# Patient Record
Sex: Male | Born: 1950 | Race: White | Hispanic: No | Marital: Married | Smoking: Never smoker
Health system: Southern US, Community
[De-identification: ages and names within clinical notes are randomized; demographics above are authoritative.]

## PROBLEM LIST (undated history)

## (undated) DIAGNOSIS — N189 Chronic kidney disease, unspecified: Secondary | ICD-10-CM

## (undated) DIAGNOSIS — M1990 Unspecified osteoarthritis, unspecified site: Secondary | ICD-10-CM

## (undated) DIAGNOSIS — N201 Calculus of ureter: Secondary | ICD-10-CM

## (undated) DIAGNOSIS — K635 Polyp of colon: Secondary | ICD-10-CM

## (undated) DIAGNOSIS — E7800 Pure hypercholesterolemia, unspecified: Secondary | ICD-10-CM

## (undated) DIAGNOSIS — K5790 Diverticulosis of intestine, part unspecified, without perforation or abscess without bleeding: Secondary | ICD-10-CM

## (undated) DIAGNOSIS — J45909 Unspecified asthma, uncomplicated: Secondary | ICD-10-CM

## (undated) DIAGNOSIS — I10 Essential (primary) hypertension: Secondary | ICD-10-CM

## (undated) DIAGNOSIS — R748 Abnormal levels of other serum enzymes: Secondary | ICD-10-CM

## (undated) DIAGNOSIS — G43909 Migraine, unspecified, not intractable, without status migrainosus: Secondary | ICD-10-CM

## (undated) DIAGNOSIS — R7303 Prediabetes: Secondary | ICD-10-CM

## (undated) DIAGNOSIS — E781 Pure hyperglyceridemia: Secondary | ICD-10-CM

## (undated) DIAGNOSIS — R3129 Other microscopic hematuria: Secondary | ICD-10-CM

## (undated) HISTORY — PX: VASECTOMY: SHX75

## (undated) HISTORY — PX: TONSILLECTOMY: SUR1361

## (undated) HISTORY — DX: Other microscopic hematuria: R31.29

## (undated) HISTORY — DX: Polyp of colon: K63.5

## (undated) HISTORY — DX: Pure hyperglyceridemia: E78.1

## (undated) HISTORY — DX: Migraine, unspecified, not intractable, without status migrainosus: G43.909

## (undated) HISTORY — DX: Abnormal levels of other serum enzymes: R74.8

## (undated) HISTORY — DX: Prediabetes: R73.03

## (undated) HISTORY — DX: Diverticulosis of intestine, part unspecified, without perforation or abscess without bleeding: K57.90

## (undated) HISTORY — DX: Calculus of ureter: N20.1

## (undated) HISTORY — DX: Unspecified osteoarthritis, unspecified site: M19.90

## (undated) HISTORY — DX: Chronic kidney disease, unspecified: N18.9

## (undated) HISTORY — DX: Unspecified asthma, uncomplicated: J45.909

---

## 1986-07-11 HISTORY — PX: CARDIAC CATHETERIZATION: SHX172

## 2004-08-18 ENCOUNTER — Encounter: Admission: RE | Admit: 2004-08-18 | Discharge: 2004-09-13 | Payer: Self-pay | Admitting: Internal Medicine

## 2006-10-06 ENCOUNTER — Ambulatory Visit (HOSPITAL_COMMUNITY): Admission: RE | Admit: 2006-10-06 | Discharge: 2006-10-06 | Payer: Self-pay | Admitting: Orthopedic Surgery

## 2006-10-09 ENCOUNTER — Encounter: Admission: RE | Admit: 2006-10-09 | Discharge: 2006-10-09 | Payer: Self-pay | Admitting: Specialist

## 2007-11-19 ENCOUNTER — Encounter: Admission: RE | Admit: 2007-11-19 | Discharge: 2007-11-19 | Payer: Self-pay | Admitting: Otolaryngology

## 2009-02-21 ENCOUNTER — Emergency Department (HOSPITAL_COMMUNITY): Admission: EM | Admit: 2009-02-21 | Discharge: 2009-02-21 | Payer: Self-pay | Admitting: Emergency Medicine

## 2009-03-04 ENCOUNTER — Encounter (INDEPENDENT_AMBULATORY_CARE_PROVIDER_SITE_OTHER): Payer: Self-pay

## 2009-03-04 ENCOUNTER — Ambulatory Visit: Payer: Self-pay

## 2009-03-04 ENCOUNTER — Encounter (INDEPENDENT_AMBULATORY_CARE_PROVIDER_SITE_OTHER): Payer: Self-pay | Admitting: Diagnostic Neuroimaging

## 2009-03-04 ENCOUNTER — Encounter: Payer: Self-pay | Admitting: Cardiovascular Disease

## 2009-03-08 ENCOUNTER — Encounter: Admission: RE | Admit: 2009-03-08 | Discharge: 2009-03-08 | Payer: Self-pay | Admitting: Diagnostic Neuroimaging

## 2010-10-17 LAB — BASIC METABOLIC PANEL
Calcium: 9.4 mg/dL (ref 8.4–10.5)
GFR calc Af Amer: >60 mL/min (ref >60)
GFR calc non Af Amer: >60 mL/min (ref >60)
Potassium: 3.7 mEq/L (ref 3.5–5.1)
Sodium: 137 mEq/L (ref 135–145)

## 2010-10-17 LAB — CBC
HCT: 42.6 % (ref 39.0–52.0)
Hemoglobin: 14.8 g/dL (ref 13.0–17.0)
RBC: 4.6 MIL/uL (ref 4.22–5.81)
RDW: 12.7 % (ref 11.5–15.5)
WBC: 10.7 10*3/uL — ABNORMAL HIGH (ref 4.0–10.5)

## 2011-08-12 DIAGNOSIS — R748 Abnormal levels of other serum enzymes: Secondary | ICD-10-CM

## 2011-08-12 DIAGNOSIS — N201 Calculus of ureter: Secondary | ICD-10-CM

## 2011-08-12 HISTORY — DX: Abnormal levels of other serum enzymes: R74.8

## 2011-08-12 HISTORY — DX: Calculus of ureter: N20.1

## 2011-08-16 ENCOUNTER — Emergency Department (HOSPITAL_COMMUNITY)
Admission: EM | Admit: 2011-08-16 | Discharge: 2011-08-16 | Disposition: A | Discharge status: Home / Self Care | Payer: BC Managed Care – PPO | Attending: Emergency Medicine | Admitting: Emergency Medicine

## 2011-08-16 ENCOUNTER — Emergency Department (HOSPITAL_COMMUNITY): Payer: BC Managed Care – PPO

## 2011-08-16 ENCOUNTER — Encounter (HOSPITAL_COMMUNITY): Payer: Self-pay | Admitting: Emergency Medicine

## 2011-08-16 DIAGNOSIS — R1032 Left lower quadrant pain: Secondary | ICD-10-CM | POA: Insufficient documentation

## 2011-08-16 DIAGNOSIS — R110 Nausea: Secondary | ICD-10-CM | POA: Insufficient documentation

## 2011-08-16 DIAGNOSIS — Z8639 Personal history of other endocrine, nutritional and metabolic disease: Secondary | ICD-10-CM | POA: Insufficient documentation

## 2011-08-16 DIAGNOSIS — R10814 Left lower quadrant abdominal tenderness: Secondary | ICD-10-CM | POA: Insufficient documentation

## 2011-08-16 DIAGNOSIS — Z7982 Long term (current) use of aspirin: Secondary | ICD-10-CM | POA: Insufficient documentation

## 2011-08-16 DIAGNOSIS — Z862 Personal history of diseases of the blood and blood-forming organs and certain disorders involving the immune mechanism: Secondary | ICD-10-CM | POA: Insufficient documentation

## 2011-08-16 DIAGNOSIS — I10 Essential (primary) hypertension: Secondary | ICD-10-CM | POA: Insufficient documentation

## 2011-08-16 DIAGNOSIS — E7800 Pure hypercholesterolemia, unspecified: Secondary | ICD-10-CM | POA: Insufficient documentation

## 2011-08-16 DIAGNOSIS — N200 Calculus of kidney: Secondary | ICD-10-CM | POA: Insufficient documentation

## 2011-08-16 DIAGNOSIS — Z79899 Other long term (current) drug therapy: Secondary | ICD-10-CM | POA: Insufficient documentation

## 2011-08-16 HISTORY — DX: Essential (primary) hypertension: I10

## 2011-08-16 HISTORY — DX: Pure hypercholesterolemia, unspecified: E78.00

## 2011-08-16 LAB — CBC
HCT: 44.5 % (ref 39.0–52.0)
Hemoglobin: 15.5 g/dL (ref 13.0–17.0)
MCH: 31.1 pg (ref 26.0–34.0)
MCHC: 34.8 g/dL (ref 30.0–36.0)
MCV: 89.4 fL (ref 78.0–100.0)
RBC: 4.98 MIL/uL (ref 4.22–5.81)

## 2011-08-16 LAB — URINALYSIS, ROUTINE W REFLEX MICROSCOPIC
Ketones, ur: NEGATIVE mg/dL
Leukocytes, UA: NEGATIVE
Nitrite: NEGATIVE
Specific Gravity, Urine: 1.017 (ref 1.005–1.030)
Urobilinogen, UA: 0.2 mg/dL (ref 0.0–1.0)
pH: 6 (ref 5.0–8.0)

## 2011-08-16 LAB — DIFFERENTIAL
Basophils Absolute: 0 10*3/uL (ref 0.0–0.1)
Basophils Relative: 0 % (ref 0–1)
Eosinophils Relative: 0 % (ref 0–5)
Lymphs Abs: 1.2 10*3/uL (ref 0.7–4.0)
Monocytes Absolute: 0.6 10*3/uL (ref 0.1–1.0)
Monocytes Relative: 4 % (ref 3–12)
Neutrophils Relative %: 88 % — ABNORMAL HIGH (ref 43–77)

## 2011-08-16 LAB — POCT I-STAT, CHEM 8
Calcium, Ion: 1.14 mmol/L (ref 1.12–1.32)
Chloride: 106 mEq/L (ref 96–112)
HCT: 45 % (ref 39.0–52.0)
Hemoglobin: 15.3 g/dL (ref 13.0–17.0)

## 2011-08-16 MED ORDER — IOHEXOL 300 MG/ML  SOLN: 20.0000 mL | INTRAMUSCULAR | Status: AC

## 2011-08-16 MED ORDER — HYDROMORPHONE HCL PF 1 MG/ML IJ SOLN
1.0000 mg | Freq: Once | INTRAMUSCULAR | Status: AC
  Administered 2011-08-16: 1 mg via INTRAVENOUS
  Filled 2011-08-16: qty 1

## 2011-08-16 MED ORDER — TAMSULOSIN HCL 0.4 MG PO CAPS: 0.4000 mg | ORAL_CAPSULE | Freq: Every day | ORAL | Status: DC

## 2011-08-16 MED ORDER — HYDROCODONE-ACETAMINOPHEN 5-500 MG PO TABS: 1.0000 | ORAL_TABLET | Freq: Four times a day (QID) | ORAL | Status: AC | PRN

## 2011-08-16 MED ORDER — SODIUM CHLORIDE 0.9 % IV SOLN
INTRAVENOUS | Status: DC
  Administered 2011-08-16: 07:00:00 via INTRAVENOUS

## 2011-08-16 MED ORDER — IOHEXOL 300 MG/ML  SOLN
80.0000 mL | Freq: Once | INTRAMUSCULAR | Status: AC | PRN
  Administered 2011-08-16: 80 mL via INTRAVENOUS

## 2011-08-16 MED ORDER — SODIUM CHLORIDE 0.9 % IV BOLUS (SEPSIS)
500.0000 mL | Freq: Once | INTRAVENOUS | Status: AC
  Administered 2011-08-16: 500 mL via INTRAVENOUS

## 2011-08-16 NOTE — ED Notes (Signed)
PT. REPORTS LLQ PAIN WITH NAUSEA ONSET LAST NIGHT , SLIGHT NAUSEA , NO VOMITTING OR DIARRHEA. DENIES FEVER OR CHILLS.

## 2011-08-16 NOTE — ED Provider Notes (Signed)
History     CSN: [id number]  Arrival date & time 08/16/11  8295   First MD Initiated Contact with Patient 08/16/11 0502      Chief Complaint  Patient presents with  . Abdominal Pain    (Consider location/radiation/quality/duration/timing/severity/associated sxs/prior treatment) Patient is a 61 y.o. male presenting with abdominal pain. The history is provided by the patient.  Abdominal Pain The primary symptoms of the illness include abdominal pain and nausea. The primary symptoms of the illness do not include shortness of breath, vomiting or diarrhea.  Symptoms associated with the illness do not include constipation or back pain.   patient developed left lower quadrant abdominal pain with nausea since last night. He came on acutely. This moved somewhat medially since it started. No fever. He's had a bowel movement since without relief. No constipation. He has similar episode previously, that pain resolved on its own. He then had a colonoscopy that showed diverticulosis. The pain is crampy. No dysuria. No fevers. No trauma.  Past Medical History  Diagnosis Date  . Hypertension   . Gout   . Hypercholesteremia     Past Surgical History  Procedure Date  . Tonsillectomy   . Vasectomy     No family history on file.  History  Substance Use Topics  . Smoking status: Never Smoker   . Smokeless tobacco: Not on file  . Alcohol Use: No      Review of Systems  Constitutional: Negative for activity change and appetite change.  HENT: Negative for neck stiffness.   Eyes: Negative for pain.  Respiratory: Negative for chest tightness and shortness of breath.   Cardiovascular: Negative for chest pain and leg swelling.  Gastrointestinal: Positive for nausea and abdominal pain. Negative for vomiting, diarrhea, constipation and blood in stool.  Genitourinary: Negative for flank pain.  Musculoskeletal: Negative for back pain.  Skin: Negative for rash.  Neurological: Negative for  weakness, numbness and headaches.  Psychiatric/Behavioral: Negative for behavioral problems.    Allergies  Ciprofloxacin; Indomethacin; and Metronidazole  Home Medications   Current Outpatient Rx  Name Route Sig Dispense Refill  . AMLODIPINE BESYLATE 2.5 MG PO TABS Oral Take 2.5 mg by mouth daily.    . ASPIRIN EC 81 MG PO TBEC Oral Take 162 mg by mouth daily.    . CELECOXIB 200 MG PO CAPS Oral Take 200 mg by mouth daily.    Marland Kitchen VITAMIN D 1000 UNITS PO TABS Oral Take 2,000 Units by mouth daily.    . COLCHICINE 0.6 MG PO TABS Oral Take 0.6 mg by mouth daily as needed. For gout flare ups    . EPLERENONE 25 MG PO TABS Oral Take 25 mg by mouth daily.    Marland Kitchen HYDROCHLOROTHIAZIDE 25 MG PO TABS Oral Take 25 mg by mouth daily.    Marland Kitchen LISINOPRIL 10 MG PO TABS Oral Take 10 mg by mouth daily.    . OMEGA-3-ACID ETHYL ESTERS 1 G PO CAPS Oral Take 3 g by mouth 2 (two) times daily.    Marland Kitchen POTASSIUM CHLORIDE ER 8 MEQ PO TBCR Oral Take 24 mEq by mouth daily.    Marland Kitchen HYDROCODONE-ACETAMINOPHEN 5-500 MG PO TABS Oral Take 1-2 tablets by mouth every 6 (six) hours as needed for pain. 15 tablet 0  . TAMSULOSIN HCL 0.4 MG PO CAPS Oral Take 1 capsule (0.4 mg total) by mouth daily. 10 capsule 0    BP 135/82  Pulse 88  Temp(Src) 98.9 F (37.2 C) (Oral)  Resp  16  SpO2 96%  Physical Exam  Nursing note and vitals reviewed. Constitutional: He is oriented to person, place, and time. He appears well-developed and well-nourished.  HENT:  Head: Normocephalic.  Cardiovascular: Normal rate, regular rhythm and normal heart sounds.   No murmur heard. Pulmonary/Chest: Effort normal and breath sounds normal.  Abdominal: Soft. Bowel sounds are normal. He exhibits no distension and no mass. There is tenderness. There is no rebound and no guarding.       Minimal left lower quadrant tenderness without rebound or guarding. No hernias palpated  Musculoskeletal: Normal range of motion. He exhibits no edema.  Neurological: He is alert  and oriented to person, place, and time. No cranial nerve deficit.  Skin: Skin is warm and dry.  Psychiatric: He has a normal mood and affect.    ED Course  Procedures (including critical care time)  Labs Reviewed  CBC - Abnormal; Notable for the following:    WBC 14.4 (*)    All other components within normal limits  DIFFERENTIAL - Abnormal; Notable for the following:    Neutrophils Relative 88 (*)    Neutro Abs 12.6 (*)    Lymphocytes Relative 8 (*)    All other components within normal limits  POCT I-STAT, CHEM 8 - Abnormal; Notable for the following:    Potassium 3.4 (*)    BUN 24 (*)    Glucose, Bld 160 (*)    All other components within normal limits  URINALYSIS, ROUTINE W REFLEX MICROSCOPIC   Ct Abdomen Pelvis W Contrast  08/16/2011  *RADIOLOGY REPORT*  Clinical Data: Left lower quadrant pain.  CT ABDOMEN AND PELVIS WITH CONTRAST  Technique:  Multidetector CT imaging of the abdomen and pelvis was performed following the standard protocol during bolus administration of intravenous contrast.  Contrast: 80mL OMNIPAQUE IOHEXOL 300 MG/ML IV SOLN  Comparison: None.  Findings: Mild atelectasis at the lung bases.  There is diffuse fatty infiltration of the liver.  The gallbladder, pancreas, spleen, adrenal glands are normal.  There is perinephric stranding on the left and mild hydronephrosis. This is secondary to an obstructing calculus within the distal left ureter measuring 4 mm (image 79).  The distal calculus is approximately 4 cm from the vesicoureteral junction.  There is additional 1 mm calculus within the left kidney.  No evidence of right nephrolithiasis or ureterolithiasis. There is delayed renal excretion on the left.  The stomach, small bowel, appendix, and cecum are normal.  The colon demonstrates multiple diverticula through the sigmoid region.  Abdominal aorta normal caliber.  No retroperitoneal periportal lymphadenopathy.  No evidence of bladder stones.  Prostate gland is  normal.  No pelvic lymphadenopathy.  Review of  bone windows demonstrates no aggressive osseous lesions.  IMPRESSION:  1.  Partially obstructing distal left ureteral calculus with mild hydronephrosis on the left and perinephric stranding. 2.  Additional small left renal calculus. 3.  Sigmoid diverticulosis without diverticulitis.  Original Report Authenticated By: Genevive Bi, M.D.     1. Nephrolithiasis       MDM  LLQ abdominal pain. History of same. CT showed stone.         Juliet Rude. Rubin Payor, MD 08/16/11 2256

## 2011-08-16 NOTE — ED Notes (Addendum)
Pt c/o LLQ abdominal pain that radiates to umbilicus since 2230 last night. Pt has been having difficulty with having BM's for the past three days and started taking Metamucil for the last two days.  Stated he was seen at an Urgent care facility some time in September and they diagnosed him with diverticulitis.    Pt has had an episode of nausea around 2230 last night but denies any emesis.

## 2011-08-16 NOTE — ED Provider Notes (Signed)
BP 128/73  Pulse 73  Temp(Src) 98.6 F (37 C) (Oral)  Resp 16  SpO2 97%   0800 Patient signed out from Dr. Rubin Payor. LLQ pain. H/o diverticulosis. CT A/P, U/A pending. Patient requesting additional pain medication at this time. Dilaudid ordered.  0933 AM Patient's pain is well-controlled at this time. He complains of no nausea or vomiting. He is currently tolerating by mouth. CT abdomen pelvis consistent with nephrolithiasis. There is no urinary tract infection by UA and his creatinine is within normal limits although slightly elevated from baseline. He does have a urologist but I have given him urology followup as well. Will discharge home with Vicodin and Flomax. Patient given precautions for return.  Forbes Cellar, MD 08/16/11 [phone]

## 2015-03-10 DIAGNOSIS — T7840XA Allergy, unspecified, initial encounter: Secondary | ICD-10-CM | POA: Insufficient documentation

## 2015-03-10 DIAGNOSIS — H1010 Acute atopic conjunctivitis, unspecified eye: Secondary | ICD-10-CM | POA: Insufficient documentation

## 2015-03-10 DIAGNOSIS — T6391XA Toxic effect of contact with unspecified venomous animal, accidental (unintentional), initial encounter: Secondary | ICD-10-CM | POA: Insufficient documentation

## 2015-03-10 DIAGNOSIS — T63441A Toxic effect of venom of bees, accidental (unintentional), initial encounter: Secondary | ICD-10-CM | POA: Insufficient documentation

## 2015-03-10 DIAGNOSIS — J309 Allergic rhinitis, unspecified: Principal | ICD-10-CM

## 2015-03-10 DIAGNOSIS — T7840XS Allergy, unspecified, sequela: Secondary | ICD-10-CM

## 2015-03-10 DIAGNOSIS — T6391XS Toxic effect of contact with unspecified venomous animal, accidental (unintentional), sequela: Secondary | ICD-10-CM

## 2015-04-08 ENCOUNTER — Ambulatory Visit (INDEPENDENT_AMBULATORY_CARE_PROVIDER_SITE_OTHER): Payer: BLUE CROSS/BLUE SHIELD

## 2015-04-08 DIAGNOSIS — Z91038 Other insect allergy status: Secondary | ICD-10-CM

## 2015-04-08 DIAGNOSIS — Z91030 Bee allergy status: Secondary | ICD-10-CM

## 2015-04-08 DIAGNOSIS — J309 Allergic rhinitis, unspecified: Secondary | ICD-10-CM | POA: Diagnosis not present

## 2015-04-08 DIAGNOSIS — H1010 Acute atopic conjunctivitis, unspecified eye: Secondary | ICD-10-CM

## 2015-05-08 ENCOUNTER — Ambulatory Visit (INDEPENDENT_AMBULATORY_CARE_PROVIDER_SITE_OTHER): Payer: BLUE CROSS/BLUE SHIELD

## 2015-05-08 DIAGNOSIS — J309 Allergic rhinitis, unspecified: Secondary | ICD-10-CM

## 2015-06-03 ENCOUNTER — Ambulatory Visit (INDEPENDENT_AMBULATORY_CARE_PROVIDER_SITE_OTHER): Payer: BLUE CROSS/BLUE SHIELD | Admitting: *Deleted

## 2015-06-03 DIAGNOSIS — J309 Allergic rhinitis, unspecified: Secondary | ICD-10-CM

## 2016-07-20 NOTE — Addendum Note (Signed)
Addended by: Maryjean MornFREEMAN, Brylinn Teaney D on: 07/20/2016 12:06 PM   Modules accepted: Orders

## 2016-11-28 ENCOUNTER — Ambulatory Visit
Admission: EM | Admit: 2016-11-28 | Discharge: 2016-11-28 | Disposition: A | Discharge status: Home / Self Care | Payer: Medicare Other | Attending: Family Medicine | Admitting: Family Medicine

## 2016-11-28 DIAGNOSIS — H109 Unspecified conjunctivitis: Secondary | ICD-10-CM

## 2016-11-28 MED ORDER — POLYMYXIN B-TRIMETHOPRIM 10000-0.1 UNIT/ML-% OP SOLN: 1.0000 [drp] | OPHTHALMIC | 0 refills | Status: AC

## 2016-11-28 NOTE — ED Triage Notes (Signed)
Pt c/o itching and watering of the eyes. Bilateral eye infection, in the morning his eyes are crusted shut.

## 2016-11-28 NOTE — ED Triage Notes (Deleted)
Mom says that he fell and hit his head last night and he hasnt been acting right ever since. He has been really sleep today more than normal, he fell from standing so about 2 feet and hit the right side of his head. Mom changed his diaper this morning around 8am and he hasnt voided since then. He is still drinking he has had around 12 oz of fluid this afternoon. He hasnt thrown up any. He does lay his head on mons chest and cry and touch the right side of his head.

## 2016-11-28 NOTE — ED Provider Notes (Signed)
[facility] ____________________________________________  Time seen: Approximately 9:31 PM  I have reviewed the triage vital signs and the nursing notes.   HISTORY  Chief Complaint Eye Problem   HPI Thomas Campbell is a 66 y.o. male fever evaluation of bilateral eye redness, itching, watering and discharge. Patient reports this started 3 days ago and at that time was only present in his left eye, and reports yesterday he also began having symptoms in right eye. Reports eyes recurrently itchy. States no runny nose, nasal congestion or coughing. States not consistent with normal seasonal allergies. Denies any eye pain, foreign body sensation, trauma. Denies any vision changes. Denies any chemical exposure, sick contacts, welding or concern of foreign bodies. Patient reports last 2 mornings his eyes have been matting shut with continuous drainage throughout the day. Reports otherwise as well. Denies fevers. Denies any facial swelling, rash or skin changes.   Denies recent sickness. Denies recent antibiotic use. Reports does wear glasses. No contact use. Patient also reports that his wife concerning enough similar symptoms.  Merlene LaughterStoneking, Hal, MD: PCP   Past Medical History:  Diagnosis Date  . Chronic kidney disease    left ureteral stone 08/2011- [facility]  . Diverticulosis   . Gout   . Hypercholesteremia   . Hypertension   . Microscopic hematuria    Dr Retta Dionesahlstedt 09/2010 low risk stress test Cardiolite7/2012,1988 cath (in setting vasovagal syncope/a fib )  . Migraine    cartoid dopplers normal    Patient Active Problem List   Diagnosis Date Noted  . Allergic rhinoconjunctivitis 03/10/2015  . 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 Bee sting reaction 03/10/2015  . Allergic reaction 03/10/2015    Past Surgical History:  Procedure Laterality Date  . CARDIAC CATHETERIZATION  1988   cardiolite 01/2011  . TONSILLECTOMY    . VASECTOMY       No current  facility-administered medications for this encounter.   Current Outpatient Prescriptions:  .  amLODipine (NORVASC) 2.5 MG tablet, Take 2.5 mg by mouth daily., Disp: , Rfl:  .  aspirin EC 81 MG tablet, Take 162 mg by mouth daily., Disp: , Rfl:  .  cholecalciferol (VITAMIN D) 1000 UNITS tablet, Take 2,000 Units by mouth daily., Disp: , Rfl:  .  lisinopril (PRINIVIL,ZESTRIL) 10 MG tablet, Take 10 mg by mouth daily., Disp: , Rfl:  .  omega-3 acid ethyl esters (LOVAZA) 1 G capsule, Take 3 g by mouth 2 (two) times daily., Disp: , Rfl:  .  celecoxib (CELEBREX) 200 MG capsule, Take 200 mg by mouth daily., Disp: , Rfl:  .  colchicine 0.6 MG tablet, Take 0.6 mg by mouth daily as needed. For gout flare ups, Disp: , Rfl:  .  EPINEPHrine (EPIPEN 2-PAK) 0.3 mg/0.3 mL IJ SOAJ injection, Inject 0.3 mg into the muscle once., Disp: , Rfl:  .  eplerenone (INSPRA) 25 MG tablet, Take 25 mg by mouth daily., Disp: , Rfl:  .  potassium chloride (KLOR-CON) 8 MEQ tablet, Take 24 mEq by mouth daily., Disp: , Rfl:  .  trimethoprim-polymyxin b (POLYTRIM) ophthalmic solution, Place 1 drop into both eyes every 4 (four) hours., Disp: 10 mL, Rfl: 0 .  VENOMIL HONEY BEE VENOM IJ, Inject as directed every 8 (eight) weeks., Disp: , Rfl:   Allergies Ciprofloxacin; Hctz [hydrochlorothiazide]; Indomethacin; Metronidazole; and Spironolactone  History reviewed. No pertinent family history.  Social History Social History  Substance Use Topics  . Smoking status: Never Smoker  . Smokeless tobacco: Never Used  .  Alcohol use No    Review of Systems Constitutional: No fever/chills Eyes: No visual changes. ENT: No sore throat. Cardiovascular: Denies chest pain. Respiratory: Denies shortness of breath. Gastrointestinal: No abdominal pain.  Musculoskeletal: Negative for back pain. Skin: Negative for rash.   ____________________________________________   PHYSICAL EXAM:  VITAL SIGNS: ED Triage Vitals  Enc Vitals Group      BP 11/28/16 1827 (!) 162/65     Pulse Rate 11/28/16 1827 (!) 51     Resp 11/28/16 1827 18     Temp 11/28/16 1827 98 F (36.7 C)     Temp Source 11/28/16 1827 Oral     SpO2 11/28/16 1827 99 %     Weight 11/28/16 1824 227 lb (103 kg)     Height 11/28/16 1824 6\' 1"  (1.854 m)     Head Circumference --      Peak Flow --      Pain Score 11/28/16 1824 0     Pain Loc --      Pain Edu? --      Excl. in GC? --      Visual Acuity  Right Eye Distance: 20/20 Left Eye Distance: 20/20 Bilateral Distance: 20/20   Constitutional: Alert and oriented. Well appearing and in no acute distress. Eyes: Conjunctiva mild to moderate injection bilaterally. Left active yellowish drainage left, no active right drainage. No appearance of foreign bodies. PERRL. EOMI. no pain with EOMs. No surrounding tenderness, swelling or erythema. Nontender bilaterally. ENT      Head: Normocephalic and atraumatic.      Nose: No congestion/rhinnorhea.      Mouth/Throat: Mucous membranes are moist.Oropharynx non-erythematous. Neck: No stridor. Supple without meningismus.  Hematological/Lymphatic/Immunilogical: No cervical lymphadenopathy. Cardiovascular: Normal rate, regular rhythm. Grossly normal heart sounds.  Good peripheral circulation. Respiratory: Normal respiratory effort without tachypnea nor retractions. Breath sounds are clear and equal bilaterally. No wheezes, rales, rhonchi. Neurologic:  Normal speech and language. Speech is normal. No gait instability.  Skin:  Skin is warm, dry Psychiatric: Mood and affect are normal. Speech and behavior are normal. Patient exhibits appropriate insight and judgment   ___________________________________________   LABS (all labs ordered are listed, but only abnormal results are displayed)  Labs Reviewed - No data to display  PROCEDURES Procedures   INITIAL IMPRESSION / ASSESSMENT AND PLAN / ED COURSE  Pertinent labs & imaging results that were available during my care  of the patient were reviewed by me and considered in my medical decision making (see chart for details).  Well-appearing patient. No acute distress. Suspect bacterial conjunctivitis. Patient declines any concerns of foreign bodies, exposure or trauma. Discussed with patient we'll treat with Polytrim ophthalmic ointment. Encouraged supportive care. Discussed follow-up with ophthalmology as needed for continued complaints.Discussed indication, risks and benefits of medications with patient.  Discussed follow up with Primary care physician this week as needed. Discussed follow up and return parameters including no resolution or any worsening concerns. Patient verbalized understanding and agreed to plan.   ____________________________________________   FINAL CLINICAL IMPRESSION(S) / ED DIAGNOSES  Final diagnoses:  Bacterial conjunctivitis     Discharge Medication List as of 11/28/2016  7:44 PM    START taking these medications   Details  trimethoprim-polymyxin b (POLYTRIM) ophthalmic solution Place 1 drop into both eyes every 4 (four) hours., Starting Mon 11/28/2016, Until Mon 12/05/2016, Normal        Note: This dictation was prepared with Dragon dictation along with smaller phrase technology. Any transcriptional errors that  result from this process are unintentional.         Renford Dills, NP 11/28/16 2135

## 2016-11-28 NOTE — Discharge Instructions (Signed)
Take medication as prescribed.Avoid rubbing.  Follow up with ophthalmology as needed, as discussed.   Follow up with your primary care physician this week as needed. Return to Urgent care for new or worsening concerns.

## 2017-03-29 ENCOUNTER — Other Ambulatory Visit: Payer: Self-pay | Admitting: Geriatric Medicine

## 2017-03-29 DIAGNOSIS — R011 Cardiac murmur, unspecified: Secondary | ICD-10-CM

## 2017-04-06 ENCOUNTER — Other Ambulatory Visit: Payer: Self-pay

## 2017-04-06 ENCOUNTER — Ambulatory Visit (HOSPITAL_COMMUNITY): Payer: BLUE CROSS/BLUE SHIELD | Attending: Cardiology

## 2017-04-06 DIAGNOSIS — N189 Chronic kidney disease, unspecified: Secondary | ICD-10-CM | POA: Diagnosis not present

## 2017-04-06 DIAGNOSIS — I129 Hypertensive chronic kidney disease with stage 1 through stage 4 chronic kidney disease, or unspecified chronic kidney disease: Secondary | ICD-10-CM | POA: Diagnosis not present

## 2017-04-06 DIAGNOSIS — R011 Cardiac murmur, unspecified: Secondary | ICD-10-CM | POA: Diagnosis not present

## 2017-04-06 DIAGNOSIS — E785 Hyperlipidemia, unspecified: Secondary | ICD-10-CM | POA: Insufficient documentation

## 2017-04-06 DIAGNOSIS — I352 Nonrheumatic aortic (valve) stenosis with insufficiency: Secondary | ICD-10-CM | POA: Insufficient documentation

## 2017-05-22 ENCOUNTER — Encounter: Payer: Self-pay | Admitting: Cardiology

## 2017-06-06 ENCOUNTER — Ambulatory Visit (INDEPENDENT_AMBULATORY_CARE_PROVIDER_SITE_OTHER): Payer: BLUE CROSS/BLUE SHIELD | Admitting: Cardiology

## 2017-06-06 ENCOUNTER — Encounter: Payer: Self-pay | Admitting: Cardiology

## 2017-06-06 VITALS — BP 140/84 | HR 50 | Ht 73.0 in | Wt 238.0 lb

## 2017-06-06 DIAGNOSIS — R55 Syncope and collapse: Secondary | ICD-10-CM

## 2017-06-06 DIAGNOSIS — I10 Essential (primary) hypertension: Secondary | ICD-10-CM | POA: Diagnosis not present

## 2017-06-06 DIAGNOSIS — I351 Nonrheumatic aortic (valve) insufficiency: Secondary | ICD-10-CM | POA: Diagnosis not present

## 2017-06-06 NOTE — Patient Instructions (Signed)
Medication Instructions:  OK to take Aspirin 81 mg a day. Continue all other medications as listed.  Testing/Procedures: Your physician has requested that you have an echocardiogram. Echocardiography is a painless test that uses sound waves to create images of your heart. It provides your doctor with information about the size and shape of your heart and how well your heart's chambers and valves are working. This procedure takes approximately one hour. There are no restrictions for this procedure.  Follow-Up: Follow up in 1 year with Dr. Anne FuSkains (with a 2 D Echo).  You will receive a letter in the mail 2 months before you are due.  Please call us when you receive this letter to schedule your follow up appointment.  If you need a refill on your cardiac medications before your next appointment, please call your pharmacy.  Thank you for choosing [facility]!!

## 2017-06-06 NOTE — Progress Notes (Signed)
Cardiology Office Note:    Date:  06/06/2017   ID:  Thomas FlingJames M Garno, DOB 06/09/1951, MRN [id number]  PCP:  Merlene LaughterStoneking, Hal, MD  Cardiologist:  Donato SchultzMark Dacy Enrico, MD   Referring MD: Merlene LaughterStoneking, Hal, MD     History of Present Illness:    Thomas Campbell is a 66 y.o. male here for the evaluation of aortic regurgitation at the request of Dr. Pete GlatterStoneking.  Echocardiogram was performed on 04/06/17 secondary to murmur and demonstrated the following: - Left ventricle: The cavity size was normal. Systolic function was   normal. The estimated ejection fraction was in the range of 60%   to 65%. Wall motion was normal; there were no regional wall   motion abnormalities. Doppler parameters are consistent with   abnormal left ventricular relaxation (grade 1 diastolic   dysfunction). - Aortic valve: There was mild stenosis. There was moderate   regurgitation. Peak velocity (S): 270 cm/s. Mean gradient (S): 15   mm Hg. Valve area (VTI): 1.77 cm^2. Valve area (Vmax): 1.59 cm^2.   Valve area (Vmean): 1.36 cm^2.  In 2010 - mild AR noted on echo.   In review of history, he had a low risk stress test in July 2012, also had a cardiac catheterization in 1988 in the setting of vasovagal syncope/A. fib.  Prior carotid Dopplers were normal in the setting of migraine.  He also had elevated liver enzymes in 2013.  Currently denies any chest pain shortness of breath lower extremity edema, syncope, bleeding, orthopnea.  He has been followed for hypertension where his blood pressures are usually 130-140/70-80.  There was some question in the past about hyperaldosteronism.  He is on eplerenone.  Doing quite well on this medication.  He had gynecomastia on spironolactone.  Overall once again, he is not having any symptoms.  He is a Chief of Staffbiochemist retired.  His brother is a Development worker, communityphysician.   Past Medical History:  Diagnosis Date  . Arthritis    PROBABLE GOUT  . Asthma   . Chronic kidney disease    left ureteral stone 08/2011-  [facility]  . Colon polyp    D MAGOD  . Diverticulosis   . Elevated liver enzymes 08/2011   TEST  . Gout   . Hypercholesteremia   . Hyperglyceridemia   . Hypertension   . Microscopic hematuria    Dr Retta Dionesahlstedt 09/2010 low risk stress test Cardiolite7/2012,1988 cath (in setting vasovagal syncope/a fib )  . Migraine    cartoid dopplers normal  . Prediabetes   . Ureteral stone 08/2011   [facility]    Past Surgical History:  Procedure Laterality Date  . CARDIAC CATHETERIZATION  1988   cardiolite 01/2011  . TONSILLECTOMY    . VASECTOMY      Current Medications: Current Meds  Medication Sig  . albuterol (PROVENTIL HFA) 108 (90 Base) MCG/ACT inhaler Inhale 2 puffs every 6 (six) hours as needed into the lungs for wheezing or shortness of breath.  Marland Kitchen. amLODipine (NORVASC) 2.5 MG tablet Take 2.5 mg by mouth daily.  Marland Kitchen. aspirin EC 81 MG tablet Take 162 mg by mouth daily.  . celecoxib (CELEBREX) 200 MG capsule Take 200 mg by mouth daily.  . cholecalciferol (VITAMIN D) 1000 UNITS tablet Take 2,000 Units by mouth daily.  . colchicine 0.6 MG tablet Take 0.6 mg by mouth daily as needed. For gout flare ups  . eplerenone (INSPRA) 25 MG tablet Take 25 mg by mouth daily.  Marland Kitchen. lisinopril (PRINIVIL,ZESTRIL) 10 MG tablet  Take 10 mg by mouth daily.  . metFORMIN (GLUCOPHAGE-XR) 500 MG 24 hr tablet Take 500 mg daily with breakfast by mouth.  . omega-3 acid ethyl esters (LOVAZA) 1 G capsule Take 3 g by mouth 2 (two) times daily.  . sildenafil (VIAGRA) 100 MG tablet Take 100 mg daily as needed by mouth for erectile dysfunction.     Allergies:   Ciprofloxacin; Hctz [hydrochlorothiazide]; Indomethacin; Metronidazole; and Spironolactone   Social History   Socioeconomic History  . Marital status: Married    Spouse name: None  . Number of children: None  . Years of education: None  . Highest education level: None  Social Needs  . Financial resource strain: None  . Food insecurity -  worry: None  . Food insecurity - inability: None  . Transportation needs - medical: None  . Transportation needs - non-medical: None  Occupational History  . None  Tobacco Use  . Smoking status: Never Smoker  . Smokeless tobacco: Never Used  Substance and Sexual Activity  . Alcohol use: No  . Drug use: No  . Sexual activity: None  Other Topics Concern  . None  Social History Narrative  . None     Family History: The patient's  No early CAD history. Both died of CVA later. Father had silent MI.  ROS:   Please see the history of present illness.     All other systems reviewed and are negative.  EKGs/Labs/Other Studies Reviewed:    The following studies were reviewed today: Prior office notes, echocardiogram, lab work  EKG:  EKG is  ordered today.  The ekg ordered today demonstrates sinus bradycardia rate 50 with poor R wave progression otherwise unremarkable, no other abnormalities personally viewed  Recent Labs: No results found for requested labs within last 8760 hours.  Recent Lipid Panel No results found for: CHOL, TRIG, HDL, CHOLHDL, VLDL, LDLCALC, LDLDIRECT   Total cholesterol 201, HDL 40, LDL 107, triglycerides 275, hemoglobin A1c 5.7, hemoglobin 16.2, creatinine 1.13, potassium 3.9, ALT 24, platelets 187- 09/26/16 labs  Physical Exam:    VS:  BP 140/84   Pulse (!) 50   Ht 6\' 1"  (1.854 m)   Wt 238 lb (108 kg)   SpO2 97%   BMI 31.40 kg/m     Wt Readings from Last 3 Encounters:  06/06/17 238 lb (108 kg)  11/28/16 227 lb (103 kg)     GEN:  Well nourished, well developed in no acute distress HEENT: Normal NECK: No JVD; No carotid bruits LYMPHATICS: No lymphadenopathy CARDIAC: RRR, 2/6 systolic murmur, difficult to hear any diastolic murmur, no rubs, gallops RESPIRATORY:  Clear to auscultation without rales, wheezing or rhonchi  ABDOMEN: Soft, non-tender, non-distended MUSCULOSKELETAL:  No edema; No deformity  SKIN: Warm and dry NEUROLOGIC:  Alert and  oriented x 3 PSYCHIATRIC:  Normal affect   ASSESSMENT:    1. Aortic valve insufficiency, etiology of cardiac valve disease unspecified   2. Essential hypertension   3. Vasovagal syncope    PLAN:    In order of problems listed above:  Aortic regurgitation  -Moderate on echocardiogram in 2018.  Currently not having any symptoms.  No evidence of LV dilatation, no we will continue to monitor.  If becomes severe with LV dilatation or decrease in ejection fraction along with symptoms, aortic valve replacement may be needed.  At this time, continue with current medical management.  Control blood pressure, cholesterol, daily exercise.  Essential hypertension  -Doing very well on current  regimen.  Includes eplerenone, potassium sparing diuretic, with some questionable thoughts on hyperaldosteronism.  Currently well controlled.  Lab work has been monitored by Dr. Pete GlatterStoneking.  Potassium excellent.  Vasovagal syncope  -Has had episodes that are situational.  Resting heart rate is low.  Was hospitalized in the past for this several years ago.  Continue with hydration, know warning signals and lay down.  Medication Adjustments/Labs and Tests Ordered: Current medicines are reviewed at length with the patient today.  Concerns regarding medicines are outlined above.  Orders Placed This Encounter  Procedures  . EKG 12-Lead  . ECHOCARDIOGRAM COMPLETE   No orders of the defined types were placed in this encounter.   Signed, Donato SchultzMark Raed Schalk, MD  06/06/2017 10:54 AM    [facility]

## 2018-05-02 ENCOUNTER — Ambulatory Visit (HOSPITAL_COMMUNITY): Payer: Medicare Other | Attending: Cardiology

## 2018-05-02 ENCOUNTER — Other Ambulatory Visit: Payer: Self-pay

## 2018-05-02 DIAGNOSIS — I351 Nonrheumatic aortic (valve) insufficiency: Secondary | ICD-10-CM | POA: Diagnosis not present

## 2018-05-03 ENCOUNTER — Telehealth: Payer: Self-pay

## 2018-05-03 ENCOUNTER — Telehealth: Payer: Self-pay | Admitting: Cardiology

## 2018-05-03 DIAGNOSIS — I351 Nonrheumatic aortic (valve) insufficiency: Secondary | ICD-10-CM

## 2018-05-03 NOTE — Telephone Encounter (Signed)
New message   Patient is returning call for Echo Results.

## 2018-05-03 NOTE — Telephone Encounter (Signed)
Notes recorded by Sigurd Sos, RN on 05/03/2018 at 2:29 PM EDT The patient has been notified of the result and verbalized understanding. All questions (if any) were answered. Sigurd Sos, RN 05/03/2018 2:28 PM

## 2018-05-03 NOTE — Telephone Encounter (Signed)
-----   Message from Jake Bathe, MD sent at 05/02/2018  8:31 PM EDT ----- Mild AS and mild to moderate aortic regurgitation.  Stable.  Repeat ECHO in one year Donato Schultz, MD

## 2018-05-03 NOTE — Telephone Encounter (Signed)
Informed patient of Echo results/recommendations.  He verbalized understanding.

## 2018-06-12 ENCOUNTER — Ambulatory Visit (INDEPENDENT_AMBULATORY_CARE_PROVIDER_SITE_OTHER): Payer: Medicare Other | Admitting: Cardiology

## 2018-06-12 ENCOUNTER — Encounter: Payer: Self-pay | Admitting: Cardiology

## 2018-06-12 VITALS — BP 148/80 | HR 56 | Ht 73.0 in | Wt 235.8 lb

## 2018-06-12 DIAGNOSIS — I351 Nonrheumatic aortic (valve) insufficiency: Secondary | ICD-10-CM

## 2018-06-12 DIAGNOSIS — R55 Syncope and collapse: Secondary | ICD-10-CM

## 2018-06-12 DIAGNOSIS — I10 Essential (primary) hypertension: Secondary | ICD-10-CM | POA: Diagnosis not present

## 2018-06-12 NOTE — Progress Notes (Signed)
Cardiology Office Note:    Date:  06/12/2018   ID:  Thomas Campbell, DOB May 04, 1951, MRN [id number]  PCP:  Gale Journey, MD  Cardiologist:  Donato Schultz, MD  Electrophysiologist:  None   Referring MD: Merlene Laughter, MD     History of Present Illness:    Thomas Campbell is a 67 y.o. male is here for follow-up of aortic regurgitation.  Low risk stress test in July 2012, also had a cardiac catheterization in 1988 in the setting of vasovagal syncope/A. fib.  Prior carotid Dopplers were normal in the setting of migraine.  He also had elevated liver enzymes in 2013.  There was some question about possible hyperaldosteronism and he is on eplerenone.  He had gynecomastia on spironolactone in the past.   Denies any fevers chills nausea vomiting chest pain shortness of breath.  [place]. When sitting up for a while gasping once a day. Pulse ox 94.   History of panic attacks with decreased day light. Feels harder to breath. Vit D has helped. Doubles dose.   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  Past Medical History:  Diagnosis Date  . Arthritis    PROBABLE GOUT  . Asthma   . Chronic kidney disease    left ureteral stone 08/2011- [facility]  . Colon polyp    D MAGOD  . Diverticulosis   . Elevated liver enzymes 08/2011   TEST  . Gout   . Hypercholesteremia   . Hyperglyceridemia   . Hypertension   . Microscopic hematuria    Dr Retta Diones 09/2010 low risk stress test Cardiolite7/2012,1988 cath (in setting vasovagal syncope/a fib )  . Migraine    cartoid dopplers normal  . Prediabetes   . Ureteral stone 08/2011   [facility]    Past Surgical History:  Procedure Laterality Date  . CARDIAC CATHETERIZATION  1988   cardiolite 01/2011  . TONSILLECTOMY    . VASECTOMY      Current Medications: Current Meds  Medication Sig  . albuterol (PROVENTIL HFA) 108 (90 Base) MCG/ACT inhaler Inhale 2 puffs every 6 (six) hours as needed into  the lungs for wheezing or shortness of breath.  Marland Kitchen amLODipine (NORVASC) 2.5 MG tablet Take 2.5 mg by mouth daily.  Marland Kitchen aspirin EC 81 MG tablet Take 81 mg by mouth daily.  . celecoxib (CELEBREX) 200 MG capsule Take 200 mg by mouth daily.  . cholecalciferol (VITAMIN D) 1000 UNITS tablet Take 2,000 Units by mouth daily.  . colchicine 0.6 MG tablet Take 0.6 mg by mouth daily as needed. For gout flare ups  . eplerenone (INSPRA) 25 MG tablet Take 25 mg by mouth daily.  . fluticasone (FLONASE) 50 MCG/ACT nasal spray Place 1 spray into both nostrils daily.  . fluticasone furoate-vilanterol (BREO ELLIPTA) 100-25 MCG/INH AEPB Inhale 1 puff into the lungs daily.  Marland Kitchen lisinopril (PRINIVIL,ZESTRIL) 10 MG tablet Take 10 mg by mouth daily.  . metFORMIN (GLUCOPHAGE-XR) 500 MG 24 hr tablet Take 500 mg daily with breakfast by mouth.  . omega-3 acid ethyl esters (LOVAZA) 1 G capsule Take 3 g by mouth 2 (two) times daily.  . sildenafil (VIAGRA) 100 MG tablet Take 100 mg daily as needed by mouth for erectile dysfunction.     Allergies:   Ciprofloxacin; Hctz [hydrochlorothiazide]; Indomethacin; Metronidazole; and Spironolactone   Social History   Socioeconomic History  . Marital status: Married    Spouse name: Not on file  . Number  of children: Not on file  . Years of education: Not on file  . Highest education level: Not on file  Occupational History  . Not on file  Social Needs  . Financial resource strain: Not on file  . Food insecurity:    Worry: Not on file    Inability: Not on file  . Transportation needs:    Medical: Not on file    Non-medical: Not on file  Tobacco Use  . Smoking status: Never Smoker  . Smokeless tobacco: Never Used  Substance and Sexual Activity  . Alcohol use: No  . Drug use: No  . Sexual activity: Not on file  Lifestyle  . Physical activity:    Days per week: Not on file    Minutes per session: Not on file  . Stress: Not on file  Relationships  . Social connections:     Talks on phone: Not on file    Gets together: Not on file    Attends religious service: Not on file    Active member of club or organization: Not on file    Attends meetings of clubs or organizations: Not on file    Relationship status: Not on file  Other Topics Concern  . Not on file  Social History Narrative  . Not on file     Family History: The patient's family history is not on file.  No early family history of coronary artery disease  ROS:   Please see the history of present illness.     All other systems reviewed and are negative.  EKGs/Labs/Other Studies Reviewed:    The following studies were reviewed today:  ECHO 05/02/18: - Left ventricle: Average longitudinal LV strain is normal at   -21.3% The cavity size was mildly dilated. Systolic function was   normal. The estimated ejection fraction was in the range of 55%   to 60%. Wall motion was normal; there were no regional wall   motion abnormalities. Left ventricular diastolic function   parameters were normal. - Aortic valve: Trileaflet; severely thickened, severely calcified   leaflets. There was mild stenosis. There was mild to moderate   regurgitation. Mean gradient (S): 18 mm Hg. Peak gradient (S): 35   mm Hg. Regurgitation pressure half-time: 669 ms. - Aorta: Aortic root dimension: 38 mm (ED). Ascending aortic   diameter: 38 mm (S). - Aortic root: The aortic root was mildly dilated. - Ascending aorta: The ascending aorta was mildly dilated. - Left atrium: The atrium was mildly dilated.  EKG:  EKG is  ordered today.  The ekg ordered today demonstrates heart rate 56 sinus bradycardia, slightly ectopic atrial rhythm downward P wave in V2 and V3.  Borderline short PR personally reviewed and interpreted.  No significant change from prior  Recent Labs: No results found for requested labs within last 8760 hours.  Recent Lipid Panel No results found for: CHOL, TRIG, HDL, CHOLHDL, VLDL, LDLCALC,  LDLDIRECT  Physical Exam:    VS:  BP (!) 148/80   Pulse (!) 56   Ht 6\' 1"  (1.854 m)   Wt 235 lb 12.8 oz (107 kg)   BMI 31.11 kg/m     Wt Readings from Last 3 Encounters:  06/12/18 235 lb 12.8 oz (107 kg)  06/06/17 238 lb (108 kg)  11/28/16 227 lb (103 kg)     GEN:  Well nourished, well developed in no acute distress HEENT: Normal NECK: No JVD; No carotid bruits LYMPHATICS: No lymphadenopathy CARDIAC: RRR, 1/6  DM RUSB, no rubs, gallops RESPIRATORY:  Clear to auscultation without rales, wheezing or rhonchi  ABDOMEN: Soft, non-tender, non-distended MUSCULOSKELETAL:  No edema; No deformity  SKIN: Warm and dry NEUROLOGIC:  Alert and oriented x 3 PSYCHIATRIC:  Normal affect   ASSESSMENT:    1. Aortic valve insufficiency, etiology of cardiac valve disease unspecified   2. Essential hypertension   3. Vasovagal syncope    PLAN:    In order of problems listed above:  Mild to moderate aortic regurgitation - Stable on echocardiogram.  Been doing quite well.  No symptoms.  Continuing to monitor.  Essential hypertension - On eplerenone.  Doing quite well.  Worried about possible upper aldosteronism.  Vasovagal syncope -Has had this in the past, hydration, salt liberalization.  He was hospitalized for this several years ago.  Situational.  Panic attacks in the fall - When daytime shortens, can have more panic attacks.  Vitamin D has helped him. - He also demonstrated some involuntary gasping when sitting up, alert, maybe once a day which is unusual for him.  Pulse ox at the time was 94%.  Medication Adjustments/Labs and Tests Ordered: Current medicines are reviewed at length with the patient today.  Concerns regarding medicines are outlined above.  Orders Placed This Encounter  Procedures  . EKG 12-Lead  . ECHOCARDIOGRAM COMPLETE   No orders of the defined types were placed in this encounter.   Patient Instructions  Medication Instructions:  Please decrease your  Aspirin to 81 mg daily.  Continue all other medications as listed.  If you need a refill on your cardiac medications before your next appointment, please call your pharmacy.   Testing/Procedures: Your physician has requested that you have an echocardiogram in 1 year. Echocardiography is a painless test that uses sound waves to create images of your heart. It provides your doctor with information about the size and shape of your heart and how well your heart's chambers and valves are working. This procedure takes approximately one hour. There are no restrictions for this procedure.  Follow-Up: At [facility], you and your health needs are our priority.  As part of our continuing mission to provide you with exceptional heart care, we have created designated Provider Care Teams.  These Care Teams include your primary Cardiologist (physician) and Advanced Practice Providers (APPs -  Physician Assistants and Nurse Practitioners) who all work together to provide you with the care you need, when you need it. You will need a follow up appointment in 12 months.  Please call our office 2 months in advance to schedule this appointment.  You may see Donato Schultz, MD or one of the following Advanced Practice Providers on your designated Care Team:   Norma Fredrickson, NP Nada Boozer, NP . Georgie Chard, NP  Thank you for choosing [facility]!!         Signed, Donato Schultz, MD  06/12/2018 12:08 PM    [facility]

## 2018-06-12 NOTE — Patient Instructions (Signed)
Medication Instructions:  Please decrease your Aspirin to 81 mg daily.  Continue all other medications as listed.  If you need a refill on your cardiac medications before your next appointment, please call your pharmacy.   Testing/Procedures: Your physician has requested that you have an echocardiogram in 1 year. Echocardiography is a painless test that uses sound waves to create images of your heart. It provides your doctor with information about the size and shape of your heart and how well your heart's chambers and valves are working. This procedure takes approximately one hour. There are no restrictions for this procedure.  Follow-Up: At [facility], you and your health needs are our priority.  As part of our continuing mission to provide you with exceptional heart care, we have created designated Provider Care Teams.  These Care Teams include your primary Cardiologist (physician) and Advanced Practice Providers (APPs -  Physician Assistants and Nurse Practitioners) who all work together to provide you with the care you need, when you need it. You will need a follow up appointment in 12 months.  Please call our office 2 months in advance to schedule this appointment.  You may see Donato SchultzMark Skains, MD or one of the following Advanced Practice Providers on your designated Care Team:   Norma FredricksonLori Gerhardt, NP Nada BoozerLaura Ingold, NP . Georgie ChardJill McDaniel, NP  Thank you for choosing [facility]!!

## 2019-06-18 ENCOUNTER — Encounter (HOSPITAL_COMMUNITY): Payer: Self-pay | Admitting: Cardiology

## 2019-09-25 ENCOUNTER — Other Ambulatory Visit: Payer: Self-pay

## 2019-09-25 ENCOUNTER — Encounter (INDEPENDENT_AMBULATORY_CARE_PROVIDER_SITE_OTHER): Payer: Self-pay

## 2019-09-25 ENCOUNTER — Ambulatory Visit (HOSPITAL_COMMUNITY): Payer: Medicare Other | Attending: Internal Medicine

## 2019-09-25 DIAGNOSIS — I351 Nonrheumatic aortic (valve) insufficiency: Secondary | ICD-10-CM | POA: Insufficient documentation

## 2019-10-17 ENCOUNTER — Encounter: Payer: Self-pay | Admitting: Cardiology

## 2019-10-17 ENCOUNTER — Ambulatory Visit (INDEPENDENT_AMBULATORY_CARE_PROVIDER_SITE_OTHER)
Admission: RE | Admit: 2019-10-17 | Discharge: 2019-10-17 | Disposition: A | Discharge status: Home / Self Care | Payer: Self-pay | Source: Ambulatory Visit | Attending: Cardiology | Admitting: Cardiology

## 2019-10-17 ENCOUNTER — Other Ambulatory Visit: Payer: Self-pay

## 2019-10-17 ENCOUNTER — Ambulatory Visit (INDEPENDENT_AMBULATORY_CARE_PROVIDER_SITE_OTHER): Payer: Medicare Other | Admitting: Cardiology

## 2019-10-17 VITALS — BP 120/70 | HR 53 | Ht 73.0 in | Wt 237.0 lb

## 2019-10-17 DIAGNOSIS — I77810 Thoracic aortic ectasia: Secondary | ICD-10-CM | POA: Diagnosis not present

## 2019-10-17 DIAGNOSIS — E785 Hyperlipidemia, unspecified: Secondary | ICD-10-CM

## 2019-10-17 DIAGNOSIS — I351 Nonrheumatic aortic (valve) insufficiency: Secondary | ICD-10-CM

## 2019-10-17 DIAGNOSIS — I10 Essential (primary) hypertension: Secondary | ICD-10-CM | POA: Diagnosis not present

## 2019-10-17 NOTE — Patient Instructions (Signed)
Medication Instructions:  The current medical regimen is effective;  continue present plan and medications.  *If you need a refill on your cardiac medications before your next appointment, please call your pharmacy*  Testing/Procedures: Your physician has requested that you have an echocardiogram in 1 year. Echocardiography is a painless test that uses sound waves to create images of your heart. It provides your doctor with information about the size and shape of your heart and how well your heart's chambers and valves are working. This procedure takes approximately one hour. There are no restrictions for this procedure.  Your physician has requested that you have Coronary Calcium Score. This test is completed by Cardiac computed tomography (CT) is a painless test that uses an x-ray machine to take clear, detailed pictures of your heart. The cost is $150 due at the time of testing.  Follow-Up: At [facility], you and your health needs are our priority.  As part of our continuing mission to provide you with exceptional heart care, we have created designated Provider Care Teams.  These Care Teams include your primary Cardiologist (physician) and Advanced Practice Providers (APPs -  Physician Assistants and Nurse Practitioners) who all work together to provide you with the care you need, when you need it.  We recommend signing up for the patient portal called "MyChart".  Sign up information is provided on this After Visit Summary.  MyChart is used to connect with patients for Virtual Visits (Telemedicine).  Patients are able to view lab/test results, encounter notes, upcoming appointments, etc.  Non-urgent messages can be sent to your provider as well.   To learn more about what you can do with MyChart, go to [email or web address].    Your next appointment:   12 month(s)  The format for your next appointment:   In Person  Provider:   Donato Schultz, MD   Thank you for choosing [facility]!!

## 2019-10-17 NOTE — Progress Notes (Signed)
Cardiology Office Note:    Date:  10/17/2019   ID:  Thomas Campbell, DOB [birth year], MRN [id number]  PCP:  Thomas Journey, MD  Cardiologist:  Thomas Schultz, MD  Electrophysiologist:  None   Referring MD: Thomas Journey, MD     History of Present Illness:    Thomas Campbell is a 69 y.o. male here for the follow-up of aortic regurgitation dilated aortic root.  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  Had gynecomastia in the past secondary to spironolactone.  Overall been doing quite well.  No fevers chills nausea vomiting syncope.  Recent echocardiogram 09/25/2019: Normal pump function Mild to moderate aortic valve regurgitation Mild aortic valve stenosis Dilated aortic root 41 mm.   Stable.  Will likely repeat ECHO in 2 years.   Past Medical History:  Diagnosis Date  . Arthritis    PROBABLE GOUT  . Asthma   . Chronic kidney disease    left ureteral stone 08/2011- [facility]  . Colon polyp    D MAGOD  . Diverticulosis   . Elevated liver enzymes 08/2011   TEST  . Gout   . Hypercholesteremia   . Hyperglyceridemia   . Hypertension   . Microscopic hematuria    Dr Thomas Campbell 09/2010 low risk stress test Cardiolite7/2012,1988 cath (in setting vasovagal syncope/a fib )  . Migraine    cartoid dopplers normal  . Prediabetes   . Ureteral stone 08/2011   [facility]    Past Surgical History:  Procedure Laterality Date  . CARDIAC CATHETERIZATION  1988   cardiolite 01/2011  . TONSILLECTOMY    . VASECTOMY      Current Medications: Current Meds  Medication Sig  . albuterol (PROVENTIL HFA) 108 (90 Base) MCG/ACT inhaler Inhale 2 puffs every 6 (six) hours as needed into the lungs for wheezing or shortness of breath.  Marland Kitchen amLODipine (NORVASC) 5 MG tablet Take 5 mg by mouth 2 (two) times daily.  Marland Kitchen aspirin EC 81 MG tablet Take 81 mg by mouth in the morning and at bedtime.   . celecoxib (CELEBREX) 200 MG capsule Take 200 mg by mouth daily.    . cholecalciferol (VITAMIN D) 1000 UNITS tablet Take 2,000 Units by mouth daily.  . colchicine 0.6 MG tablet Take 0.6 mg by mouth daily as needed. For gout flare ups  . eplerenone (INSPRA) 50 MG tablet Take 50 mg by mouth in the morning and at bedtime.  Marland Kitchen ezetimibe (ZETIA) 10 MG tablet Take 10 mg by mouth daily. Not Started Taking this Medication.  . fluticasone (FLONASE) 50 MCG/ACT nasal spray Place 1 spray into both nostrils daily.  . fluticasone furoate-vilanterol (BREO ELLIPTA) 100-25 MCG/INH AEPB Inhale 1 puff into the lungs daily.  Marland Kitchen lisinopril (PRINIVIL,ZESTRIL) 10 MG tablet Take 10 mg by mouth in the morning and at bedtime.   . metFORMIN (GLUCOPHAGE-XR) 500 MG 24 hr tablet Take 500 mg daily with breakfast by mouth.  . montelukast (SINGULAIR) 10 MG tablet Take 10 mg by mouth daily.  Marland Kitchen omega-3 acid ethyl esters (LOVAZA) 1 G capsule Take 3 g by mouth 2 (two) times daily.  . sildenafil (VIAGRA) 100 MG tablet Take 100 mg daily as needed by mouth for erectile dysfunction.     Allergies:   Ciprofloxacin, Hctz [hydrochlorothiazide], Indomethacin, Metronidazole, and Spironolactone   Social History   Socioeconomic History  . Marital status: Married    Spouse name: Not on file  . Number of  children: Not on file  . Years of education: Not on file  . Highest education level: Not on file  Occupational History  . Not on file  Tobacco Use  . Smoking status: Never Smoker  . Smokeless tobacco: Never Used  Substance and Sexual Activity  . Alcohol use: No  . Drug use: No  . Sexual activity: Not on file  Other Topics Concern  . Not on file  Social History Narrative  . Not on file   Social Determinants of Health   Financial Resource Strain:   . Difficulty of Paying Living Expenses:   Food Insecurity:   . Worried About Programme researcher, broadcasting/film/video in the Last Year:   . Barista in the Last Year:   Transportation Needs:   . Freight forwarder (Medical):   Marland Kitchen Lack of Transportation  (Non-Medical):   Physical Activity:   . Days of Exercise per Week:   . Minutes of Exercise per Session:   Stress:   . Feeling of Stress :   Social Connections:   . Frequency of Communication with Friends and Family:   . Frequency of Social Gatherings with Friends and Family:   . Attends Religious Services:   . Active Member of Clubs or Organizations:   . Attends Banker Meetings:   Marland Kitchen Marital Status:        ROS:   Please see the history of present illness.     All other systems reviewed and are negative.  EKGs/Labs/Other Studies Reviewed:    The following studies were reviewed today:  1. Left ventricular ejection fraction, by estimation, is 65 to 70%. The  left ventricle has normal function. The left ventricle has no regional  wall motion abnormalities. There is mild left ventricular hypertrophy.  Left ventricular diastolic parameters  are consistent with Grade I diastolic dysfunction (impaired relaxation).  2. Right ventricular systolic function is normal. The right ventricular  size is normal.  3. The mitral valve is abnormal. Trivial mitral valve regurgitation.  4. The aortic valve is tricuspid. Aortic valve regurgitation is mild to  moderate. Mild aortic valve stenosis. Aortic regurgitation PHT measures  412 msec. Aortic valve area, by VTI measures 1.65 cm. Aortic valve mean  gradient measures 15.4 mmHg. Aortic  valve Vmax measures 2.73 m/s.  5. Aortic dilatation noted. There is mild dilatation of the ascending  aorta measuring 41 mm.  6. The inferior vena cava is normal in size with greater than 50%  respiratory variability, suggesting right atrial pressure of 3 mmHg.   Comparison(s): 05/02/18 EF 55-60%. Mild AS mean, peak.  Mild-moderate AI.   EKG:  EKG is  ordered today.  The ekg ordered today demonstrates SB 53, LAD  Recent Labs: No results found for requested labs within last 8760 hours.  Recent Lipid Panel No results found  for: CHOL, TRIG, HDL, CHOLHDL, VLDL, LDLCALC, LDLDIRECT  Physical Exam:    VS:  BP 120/70   Pulse (!) 53   Ht 6\' 1"  (1.854 m)   Wt 237 lb (107.5 kg)   SpO2 96%   BMI 31.27 kg/m     Wt Readings from Last 3 Encounters:  10/17/19 237 lb (107.5 kg)  06/12/18 235 lb 12.8 oz (107 kg)  06/06/17 238 lb (108 kg)     GEN:  Well nourished, well developed in no acute distress HEENT: Normal NECK: No JVD; No carotid bruits LYMPHATICS: No lymphadenopathy CARDIAC: RRR, no murmurs, rubs, gallops  RESPIRATORY:  Clear to auscultation without rales, wheezing or rhonchi  ABDOMEN: Soft, non-tender, non-distended MUSCULOSKELETAL:  No edema; No deformity  SKIN: Warm and dry NEUROLOGIC:  Alert and oriented x 3 PSYCHIATRIC:  Normal affect   ASSESSMENT:    1. Essential hypertension   2. Aortic valve insufficiency, etiology of cardiac valve disease unspecified   3. Dilated aortic root ([place])   4. Hyperlipidemia, unspecified hyperlipidemia type    PLAN:    In order of problems listed above:  Hyperlipidemia -LDL 136 - tried to avoid statins in the psat. Tried Lipitor and Crestor had muscle pain.  We will go ahead and check a coronary calcium score.  If he has evidence of atherosclerosis, I will go ahead and encourage reinitiation of statin use to help reduce his risk of heart attack and stroke.  If we do need to use, I would start with low-dose Crestor 5 mg again and see how he does.  I tried to reassure him about the overall risks associated with statin use.  At this time, we can hold off on Zetia.  I do not think given his situation that he would qualify for Repatha.  Of course if we ever needed to go down this pathway, I would have him sit down with our pharmacy lead hyperlipidemia team.  Aortic regurgitation -Stable see echocardiogram report above.  Mild aortic root dilatation -Minimal change on recent echocardiogram 41 mm.  Previously 75 and 39.  We will repeat echocardiogram in 1  year.  Family history of stroke -he is currently taking aspirin 81 mg twice a day for stroke prevention.   Has had prior episodes of vasovagal syncope years ago.  Has had some anxiety and panic attacks in the past.     Medication Adjustments/Labs and Tests Ordered: Current medicines are reviewed at length with the patient today.  Concerns regarding medicines are outlined above.  Orders Placed This Encounter  Procedures  . CT CARDIAC SCORING  . EKG 12-Lead  . ECHOCARDIOGRAM COMPLETE   No orders of the defined types were placed in this encounter.   Patient Instructions  Medication Instructions:  The current medical regimen is effective;  continue present plan and medications.  *If you need a refill on your cardiac medications before your next appointment, please call your pharmacy*  Testing/Procedures: Your physician has requested that you have an echocardiogram in 1 year. Echocardiography is a painless test that uses sound waves to create images of your heart. It provides your doctor with information about the size and shape of your heart and how well your heart's chambers and valves are working. This procedure takes approximately one hour. There are no restrictions for this procedure.  Your physician has requested that you have Coronary Calcium Score. This test is completed by Cardiac computed tomography (CT) is a painless test that uses an x-ray machine to take clear, detailed pictures of your heart. The cost is $150 due at the time of testing.  Follow-Up: At [facility], you and your health needs are our priority.  As part of our continuing mission to provide you with exceptional heart care, we have created designated Provider Care Teams.  These Care Teams include your primary Cardiologist (physician) and Advanced Practice Providers (APPs -  Physician Assistants and Nurse Practitioners) who all work together to provide you with the care you need, when you need it.  We  recommend signing up for the patient portal called "MyChart".  Sign up information is provided on this  After Visit Summary.  MyChart is used to connect with patients for Virtual Visits (Telemedicine).  Patients are able to view lab/test results, encounter notes, upcoming appointments, etc.  Non-urgent messages can be sent to your provider as well.   To learn more about what you can do with MyChart, go to [email or web address].    Your next appointment:   12 month(s)  The format for your next appointment:   In Person  Provider:   Donato Schultz, MD   Thank you for choosing [facility]!!        Signed, Thomas Schultz, MD  10/17/2019 10:33 AM    [facility]

## 2020-10-15 ENCOUNTER — Ambulatory Visit (HOSPITAL_COMMUNITY): Payer: Medicare Other | Attending: Cardiovascular Disease

## 2020-10-15 ENCOUNTER — Other Ambulatory Visit: Payer: Self-pay

## 2020-10-15 DIAGNOSIS — I351 Nonrheumatic aortic (valve) insufficiency: Secondary | ICD-10-CM | POA: Diagnosis not present

## 2020-10-15 DIAGNOSIS — I77810 Thoracic aortic ectasia: Secondary | ICD-10-CM | POA: Diagnosis present

## 2020-10-15 LAB — ECHOCARDIOGRAM COMPLETE
AR max vel: 2.04 cm2
AV Area VTI: 2.46 cm2
AV Area mean vel: 2.18 cm2
AV Mean grad: 23 mmHg
AV Peak grad: 50.1 mmHg
Ao pk vel: 3.54 m/s
Area-P 1/2: 4.15 cm2
P 1/2 time: 828 msec
S' Lateral: 3.2 cm

## 2020-11-30 ENCOUNTER — Other Ambulatory Visit: Payer: Self-pay

## 2020-11-30 ENCOUNTER — Encounter: Payer: Self-pay | Admitting: Cardiology

## 2020-11-30 ENCOUNTER — Ambulatory Visit (INDEPENDENT_AMBULATORY_CARE_PROVIDER_SITE_OTHER): Payer: Medicare Other | Admitting: Cardiology

## 2020-11-30 VITALS — BP 130/70 | HR 50 | Ht 73.0 in | Wt 223.0 lb

## 2020-11-30 DIAGNOSIS — I77810 Thoracic aortic ectasia: Secondary | ICD-10-CM | POA: Diagnosis not present

## 2020-11-30 DIAGNOSIS — I350 Nonrheumatic aortic (valve) stenosis: Secondary | ICD-10-CM | POA: Diagnosis not present

## 2020-11-30 DIAGNOSIS — I351 Nonrheumatic aortic (valve) insufficiency: Secondary | ICD-10-CM

## 2020-11-30 DIAGNOSIS — I10 Essential (primary) hypertension: Secondary | ICD-10-CM

## 2020-11-30 NOTE — Patient Instructions (Signed)
Medication Instructions:  The current medical regimen is effective;  continue present plan and medications.  *If you need a refill on your cardiac medications before your next appointment, please call your pharmacy*  Testing/Procedures: Your physician has requested that you have an echocardiogram. Echocardiography is a painless test that uses sound waves to create images of your heart. It provides your doctor with information about the size and shape of your heart and how well your heart's chambers and valves are working. This procedure takes approximately one hour. There are no restrictions for this procedure.  Follow-Up: At [facility], you and your health needs are our priority.  As part of our continuing mission to provide you with exceptional heart care, we have created designated Provider Care Teams.  These Care Teams include your primary Cardiologist (physician) and Advanced Practice Providers (APPs -  Physician Assistants and Nurse Practitioners) who all work together to provide you with the care you need, when you need it.  We recommend signing up for the patient portal called "MyChart".  Sign up information is provided on this After Visit Summary.  MyChart is used to connect with patients for Virtual Visits (Telemedicine).  Patients are able to view lab/test results, encounter notes, upcoming appointments, etc.  Non-urgent messages can be sent to your provider as well.   To learn more about what you can do with MyChart, go to [email or web address].    Your next appointment:   1 year(s)  The format for your next appointment:   In Person  Provider:   Mark Skains, MD     Thank you for choosing [facility]!!    

## 2020-11-30 NOTE — Progress Notes (Signed)
Cardiology Office Note:    Date:  11/30/2020   ID:  ADELAIDO NICKLAUS, DOB 11/17/1950, MRN [id number]  PCP:  Durenda Hurt, MD   [facility] Providers Cardiologist:  Donato Schultz, MD     Referring MD: Gale Journey, MD     History of Present Illness:    Thomas Campbell is a 70 y.o. male here for follow-up of moderate aortic stenosis, possible functional bicuspid aortic valve, moderate aortic regurgitation with mildly dilated aortic root.  Coronary calcium score was 0.  Excellent.  Has had gynecomastia in the past secondary to spironolactone.  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  Echocardiogram on 09/25/2019 showed normal ejection fraction, mild to moderate aortic valve regurgitation, mild aortic valve stenosis and dilated aortic root of 41 mm.  This was stable.  We personally reviewed images from his last 2 echocardiograms and showed him the pictures.  Denies any fevers chills nausea vomiting syncope bleeding.  Past Medical History:  Diagnosis Date  . Arthritis    PROBABLE GOUT  . Asthma   . Chronic kidney disease    left ureteral stone 08/2011- [facility]  . Colon polyp    D MAGOD  . Diverticulosis   . Elevated liver enzymes 08/2011   TEST  . Gout   . Hypercholesteremia   . Hyperglyceridemia   . Hypertension   . Microscopic hematuria    Dr Retta Diones 09/2010 low risk stress test Cardiolite7/2012,1988 cath (in setting vasovagal syncope/a fib )  . Migraine    cartoid dopplers normal  . Prediabetes   . Ureteral stone 08/2011   [facility]    Past Surgical History:  Procedure Laterality Date  . CARDIAC CATHETERIZATION  1988   cardiolite 01/2011  . TONSILLECTOMY    . VASECTOMY      Current Medications: Current Meds  Medication Sig  . albuterol (VENTOLIN HFA) 108 (90 Base) MCG/ACT inhaler Inhale 2 puffs every 6 (six) hours as needed into the lungs for wheezing or shortness of breath.  Marland Kitchen amLODipine (NORVASC) 5 MG  tablet Take 5 mg by mouth 2 (two) times daily.  Marland Kitchen aspirin EC 81 MG tablet Take 81 mg by mouth daily.  . celecoxib (CELEBREX) 200 MG capsule Take 200 mg by mouth daily.  . cholecalciferol (VITAMIN D) 1000 UNITS tablet Take 2,000 Units by mouth daily.  . colchicine 0.6 MG tablet Take 0.6 mg by mouth daily as needed. For gout flare ups  . eplerenone (INSPRA) 50 MG tablet Take 50 mg by mouth in the morning and at bedtime.  . fluticasone (FLONASE) 50 MCG/ACT nasal spray Place 1 spray into both nostrils daily.  . fluticasone furoate-vilanterol (BREO ELLIPTA) 100-25 MCG/INH AEPB Inhale 1 puff into the lungs daily.  Marland Kitchen lisinopril (PRINIVIL,ZESTRIL) 10 MG tablet Take 10 mg by mouth in the morning and at bedtime.   . metFORMIN (GLUCOPHAGE-XR) 500 MG 24 hr tablet Take 500 mg daily with breakfast by mouth.  . montelukast (SINGULAIR) 10 MG tablet Take 10 mg by mouth daily.  Marland Kitchen omega-3 acid ethyl esters (LOVAZA) 1 G capsule Take 3 g by mouth 2 (two) times daily.  . sildenafil (VIAGRA) 100 MG tablet Take 100 mg daily as needed by mouth for erectile dysfunction.     Allergies:   Ciprofloxacin, Hctz [hydrochlorothiazide], Indomethacin, Metronidazole, and Spironolactone   Social History   Socioeconomic History  . Marital status: Married    Spouse name: Not on file  . Number  of children: Not on file  . Years of education: Not on file  . Highest education level: Not on file  Occupational History  . Not on file  Tobacco Use  . Smoking status: Never Smoker  . Smokeless tobacco: Never Used  Vaping Use  . Vaping Use: Never used  Substance and Sexual Activity  . Alcohol use: No  . Drug use: No  . Sexual activity: Not on file  Other Topics Concern  . Not on file  Social History Narrative  . Not on file   Social Determinants of Health   Financial Resource Strain: Not on file  Food Insecurity: Not on file  Transportation Needs: Not on file  Physical Activity: Not on file  Stress: Not on file   Social Connections: Not on file      EKGs/Labs/Other Studies Reviewed:    The following studies were reviewed today:  Coronary artery calcium score on 10/17/19: IMPRESSION: 1. Coronary calcium score of 0. This was 0 percentile for age and sex matched control.  2.  Calcified aortic valve.  3.  Mildly dilated ascending aorta 40 mm.  Donato Schultz, MD  EKG:  EKG is  ordered today.  The ekg ordered today demonstrates sinus bradycardia 50 no other abnormalities.  Recent Labs: No results found for requested labs within last 8760 hours.  Recent Lipid Panel No results found for: CHOL, TRIG, HDL, CHOLHDL, VLDL, LDLCALC, LDLDIRECT   Risk Assessment/Calculations:      Physical Exam:    VS:  BP 130/70 (BP Location: Left Arm, Patient Position: Sitting, Cuff Size: Normal)   Pulse (!) 50   Ht 6\' 1"  (1.854 m)   Wt 223 lb (101.2 kg)   SpO2 97%   BMI 29.42 kg/m     Wt Readings from Last 3 Encounters:  11/30/20 223 lb (101.2 kg)  10/17/19 237 lb (107.5 kg)  06/12/18 235 lb 12.8 oz (107 kg)     GEN:  Well nourished, well developed in no acute distress HEENT: Normal NECK: No JVD; No carotid bruits LYMPHATICS: No lymphadenopathy CARDIAC: RRR, no murmurs, rubs, gallops RESPIRATORY:  Clear to auscultation without rales, wheezing or rhonchi  ABDOMEN: Soft, non-tender, non-distended MUSCULOSKELETAL:  No edema; No deformity  SKIN: Warm and dry NEUROLOGIC:  Alert and oriented x 3 PSYCHIATRIC:  Normal affect   ASSESSMENT:    1. Aortic valve insufficiency, etiology of cardiac valve disease unspecified   2. Dilated aortic root (HCC)   3. Essential hypertension   4. Nonrheumatic aortic valve stenosis    PLAN:    In order of problems listed above:  Aortic regurgitation - Stable mild to moderate.  Echocardiogram from 2021 reviewed.  Repeat echocardiogram next year.  Aortic stenosis - Has been in the moderate category, 3.5 m/s 23 mmHg mean gradient.  Reviewed 2 prior  echocardiograms.  Last echocardiogram mention possible functional bicuspid valve.  Mild aortic root dilatation - 41 mm aortic root previously.  Stable.  There is been a range from 40 to 42 mm.  We will continue to monitor.  Family history of stroke - Aspirin 81 mg for stroke prevention.  Could consider coming off of the aspirin given his calcium score of 0.  However, given his aortic valve calcification, it is not unreasonable for him to continue.  Prior episodes of vasovagal syncope - Has had anxiety panic attacks in the past  Hyperlipidemia - Calcium score is 0.  It is reasonable for him to continue diet and exercise.  In the past, has tried Zetia, rosuvastatin.    Medication Adjustments/Labs and Tests Ordered: Current medicines are reviewed at length with the patient today.  Concerns regarding medicines are outlined above.  Orders Placed This Encounter  Procedures  . EKG 12-Lead  . ECHOCARDIOGRAM COMPLETE   No orders of the defined types were placed in this encounter.   Patient Instructions  Medication Instructions:  The current medical regimen is effective;  continue present plan and medications.  *If you need a refill on your cardiac medications before your next appointment, please call your pharmacy*  Testing/Procedures: Your physician has requested that you have an echocardiogram. Echocardiography is a painless test that uses sound waves to create images of your heart. It provides your doctor with information about the size and shape of your heart and how well your heart's chambers and valves are working. This procedure takes approximately one hour. There are no restrictions for this procedure.  Follow-Up: At [facility], you and your health needs are our priority.  As part of our continuing mission to provide you with exceptional heart care, we have created designated Provider Care Teams.  These Care Teams include your primary Cardiologist (physician) and Advanced  Practice Providers (APPs -  Physician Assistants and Nurse Practitioners) who all work together to provide you with the care you need, when you need it.  We recommend signing up for the patient portal called "MyChart".  Sign up information is provided on this After Visit Summary.  MyChart is used to connect with patients for Virtual Visits (Telemedicine).  Patients are able to view lab/test results, encounter notes, upcoming appointments, etc.  Non-urgent messages can be sent to your provider as well.   To learn more about what you can do with MyChart, go to [email or web address].    Your next appointment:   1 year(s)  The format for your next appointment:   In Person  Provider:   Donato Schultz, MD   Thank you for choosing [facility]!!        Signed, Donato Schultz, MD  11/30/2020 12:04 PM    [facility]

## 2021-08-03 IMAGING — CT CT CARDIAC CORONARY ARTERY CALCIUM SCORE
3 series · 14 of 20 positions shown, 15 images · non-contrast
Comparison: None.
COMPARISON: None.

Addendum:
EXAM:
OVER-READ INTERPRETATION  CT CHEST

The following report is an over-read performed by radiologist Dr.
Enqi Gandhi [REDACTED] on 10/17/2019. This over-read
does not include interpretation of cardiac or coronary anatomy or
pathology. The coronary calcium score interpretation by the
cardiologist is attached.
CLINICAL DATA: Risk stratification
Coronary Calcium Score
TECHNIQUE: The patient was scanned on a Siemens Force scanner. Axial
non-contrast 3 mm slices were carried out through the heart. The
data set was analyzed on a dedicated work station and scored using
the Agatson method.

[Series 2: casc 3.0 bv41 2 bestdiast 71 % · axial · 0.43mm/px · z∈[-211,-130]mm · 4 of 46 slices shown, 5 images]
[im 10/46  vessel]
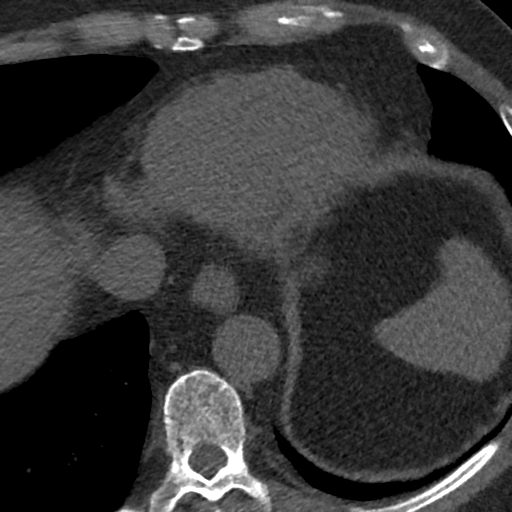
[im 10/46  lung]
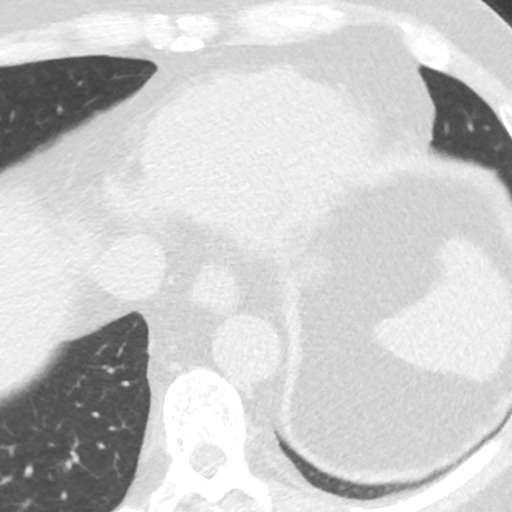
[im 19/46  vessel]
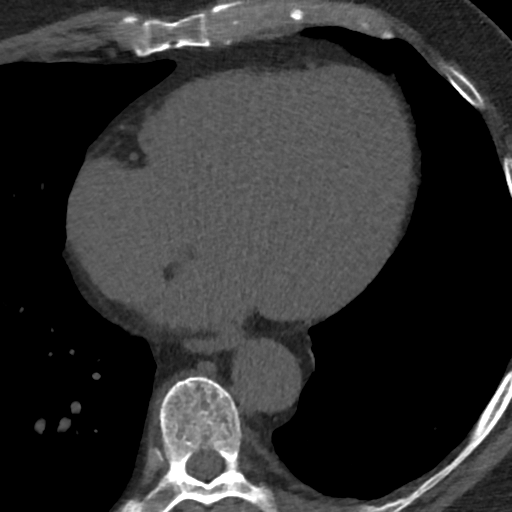
[im 28/46  vessel]
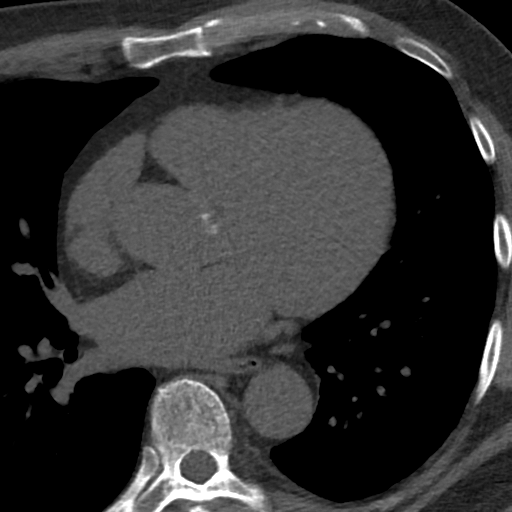
[im 37/46  vessel]
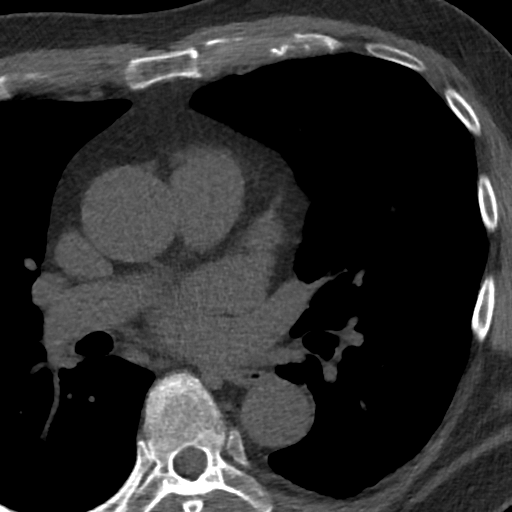

[Series 3: lung 71 % · axial · 0.72mm/px · z∈[-217,-127]mm · 5 of 46 slices shown]
[im 8/46  lung]
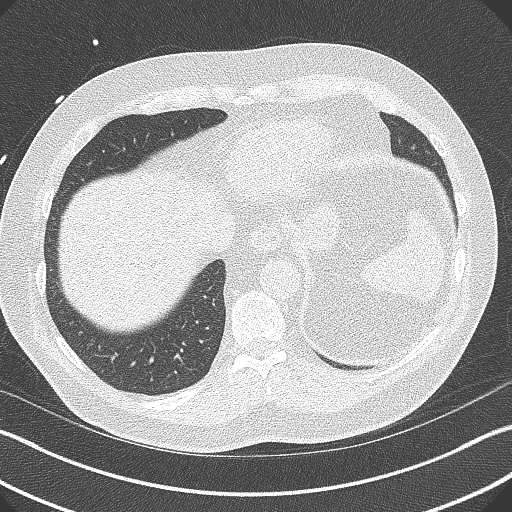
[im 16/46  lung]
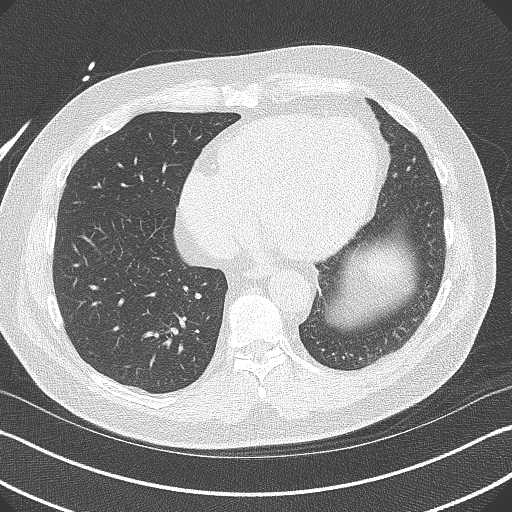
[im 23/46  lung]
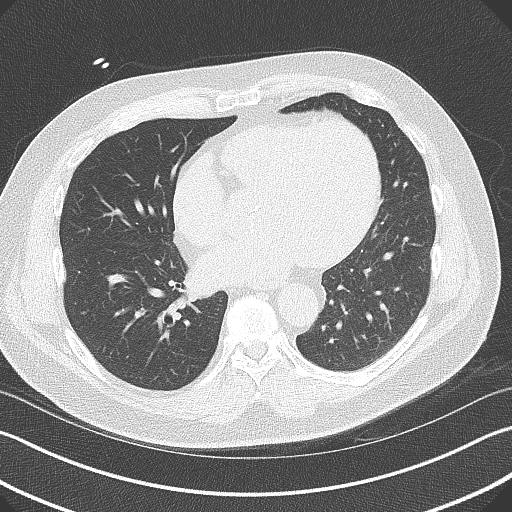
[im 31/46  lung]
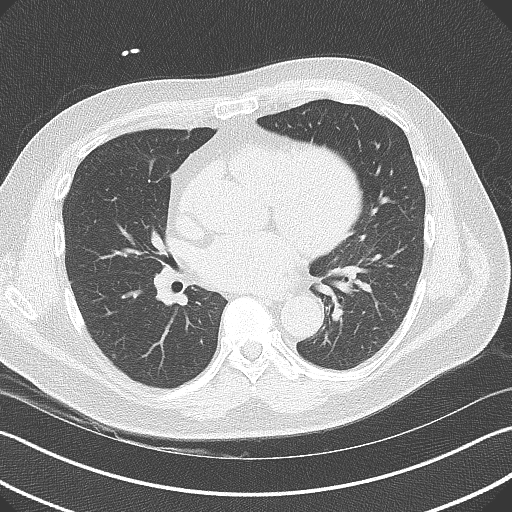
[im 38/46  lung]
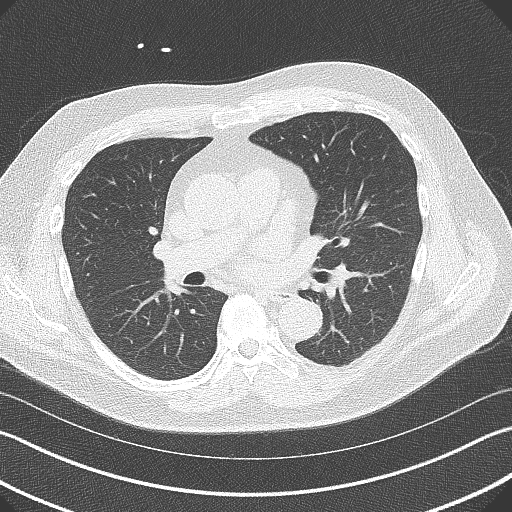

[Series 4: lung st 71 % · axial · 0.72mm/px · z∈[-217,-127]mm · 5 of 46 slices shown]
[im 8/46  lung]
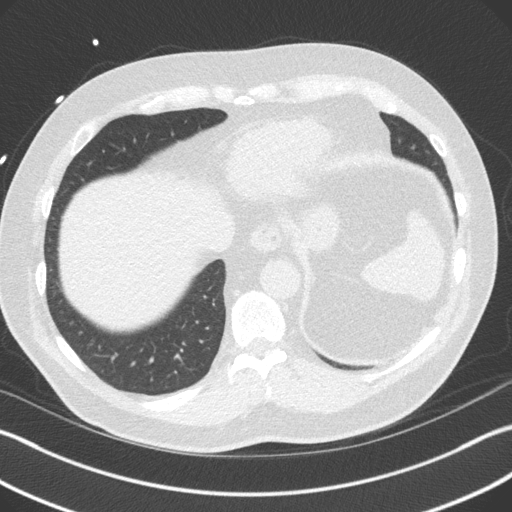
[im 16/46  lung]
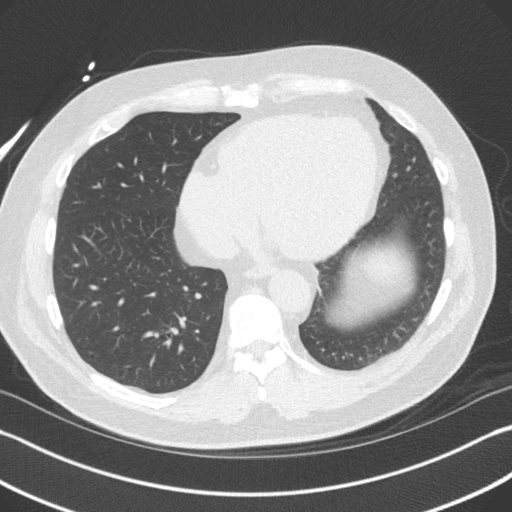
[im 23/46  lung]
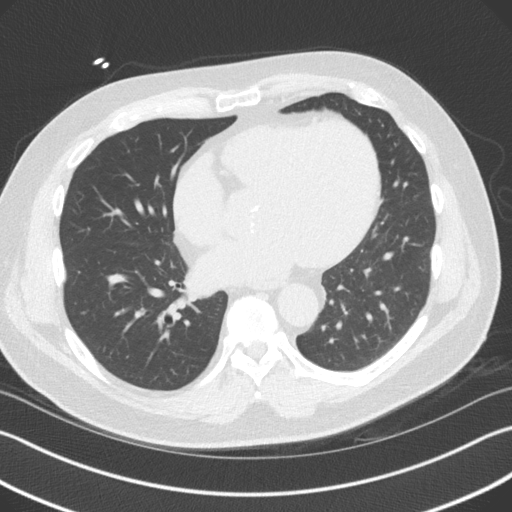
[im 31/46  lung]
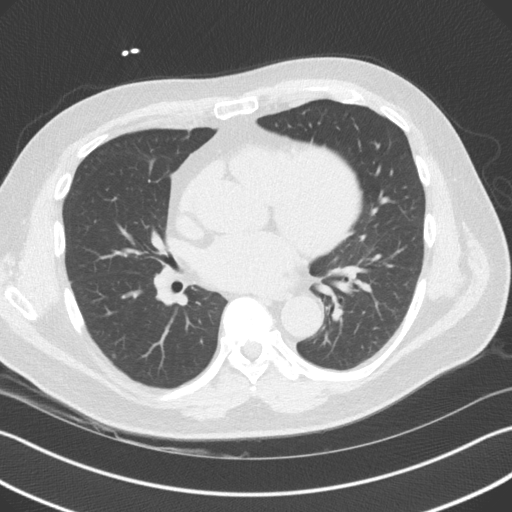
[im 38/46  lung]
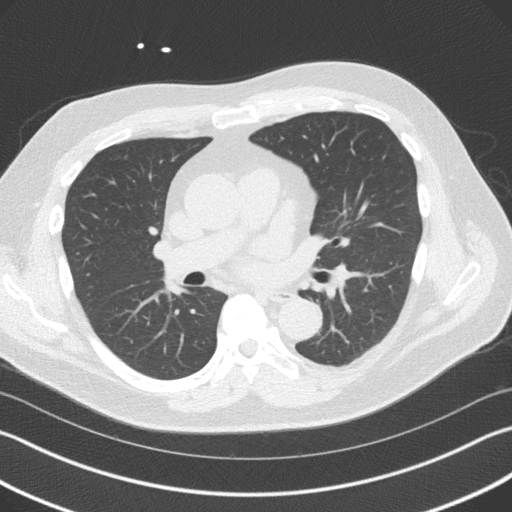

[14 of 20 positions shown; findings below may reference images not displayed]

FINDINGS: Vascular: Heart is normal size. Ascending thoracic aorta borderline
dilated at 4 cm. Calcifications in the aortic valve.

Mediastinum/Nodes: No adenopathy in the lower mediastinum or hila.

Lungs/Pleura: Small calcified granuloma in the lingula and right
middle lobe. No confluent opacities for effusions.

Upper Abdomen: Imaging into the upper abdomen shows no acute
findings.

Musculoskeletal: Chest wall soft tissues are unremarkable. No acute
bony abnormality.
IMPRESSION: 4 cm ascending thoracic aortic aneurysm. Recommend annual imaging
followup by CTA or MRA. This recommendation follows 3161
ACCF/AHA/AATS/ACR/ASA/SCA/TIGER/KLEVER/RHODEN/TILUNG Guidelines for the
Diagnosis and Management of Patients with Thoracic Aortic Disease.
Circulation. 3161; 121: [id number]. Aortic aneurysm NOS ([id number].P)

Old granulomatous disease.

No acute findings.
FINDINGS: Non-cardiac: See separate report from [REDACTED].

Ascending Aorta: Mildly dilated 40 mm. Calcified aortic valve
leaflets

Pericardium: Normal

Coronary arteries: Normal origin.
IMPRESSION: 1. Coronary calcium score of 0. This was 0 percentile for age and
sex matched control.

2.  Calcified aortic valve.

3.  Mildly dilated ascending aorta 40 mm.

*** End of Addendum ***
EXAM:
OVER-READ INTERPRETATION  CT CHEST

The following report is an over-read performed by radiologist Dr.
Enqi Gandhi [REDACTED] on 10/17/2019. This over-read
does not include interpretation of cardiac or coronary anatomy or
pathology. The coronary calcium score interpretation by the
cardiologist is attached.
FINDINGS: Vascular: Heart is normal size. Ascending thoracic aorta borderline
dilated at 4 cm. Calcifications in the aortic valve.

Mediastinum/Nodes: No adenopathy in the lower mediastinum or hila.

Lungs/Pleura: Small calcified granuloma in the lingula and right
middle lobe. No confluent opacities for effusions.

Upper Abdomen: Imaging into the upper abdomen shows no acute
findings.

Musculoskeletal: Chest wall soft tissues are unremarkable. No acute
bony abnormality.
IMPRESSION: 4 cm ascending thoracic aortic aneurysm. Recommend annual imaging
followup by CTA or MRA. This recommendation follows 3161
ACCF/AHA/AATS/ACR/ASA/SCA/TIGER/KLEVER/RHODEN/TILUNG Guidelines for the
Diagnosis and Management of Patients with Thoracic Aortic Disease.
Circulation. 3161; 121: [id number]. Aortic aneurysm NOS ([id number].P)

Old granulomatous disease.

No acute findings.

## 2021-11-11 ENCOUNTER — Ambulatory Visit (HOSPITAL_COMMUNITY): Payer: Medicare Other | Attending: Cardiology

## 2021-11-11 DIAGNOSIS — I77810 Thoracic aortic ectasia: Secondary | ICD-10-CM | POA: Insufficient documentation

## 2021-11-11 DIAGNOSIS — I351 Nonrheumatic aortic (valve) insufficiency: Secondary | ICD-10-CM | POA: Diagnosis present

## 2021-11-11 LAB — ECHOCARDIOGRAM COMPLETE
AR max vel: 2.41 cm2
AV Area VTI: 2.1 cm2
AV Area mean vel: 2.32 cm2
AV Mean grad: 17 mmHg
AV Peak grad: 32.4 mmHg
Ao pk vel: 2.85 m/s
Area-P 1/2: 3.99 cm2
P 1/2 time: 746 msec
S' Lateral: 3.7 cm

## 2021-11-12 ENCOUNTER — Other Ambulatory Visit: Payer: Self-pay | Admitting: *Deleted

## 2021-11-12 DIAGNOSIS — I351 Nonrheumatic aortic (valve) insufficiency: Secondary | ICD-10-CM

## 2021-11-12 DIAGNOSIS — I77810 Thoracic aortic ectasia: Secondary | ICD-10-CM

## 2021-11-12 DIAGNOSIS — I350 Nonrheumatic aortic (valve) stenosis: Secondary | ICD-10-CM

## 2021-11-12 NOTE — Progress Notes (Signed)
Repeat echo order placed for 11/2022 per Dr Candee Furbish.   ?

## 2022-11-11 ENCOUNTER — Ambulatory Visit (HOSPITAL_COMMUNITY): Payer: Medicare Other | Attending: Cardiology

## 2022-11-11 DIAGNOSIS — I351 Nonrheumatic aortic (valve) insufficiency: Secondary | ICD-10-CM | POA: Insufficient documentation

## 2022-11-11 DIAGNOSIS — I77810 Thoracic aortic ectasia: Secondary | ICD-10-CM | POA: Diagnosis present

## 2022-11-11 DIAGNOSIS — I350 Nonrheumatic aortic (valve) stenosis: Secondary | ICD-10-CM | POA: Insufficient documentation

## 2022-11-11 LAB — ECHOCARDIOGRAM COMPLETE
AR max vel: 1.65 cm2
AV Area VTI: 1.77 cm2
AV Area mean vel: 1.5 cm2
AV Mean grad: 17.5 mmHg
AV Peak grad: 33.3 mmHg
Ao pk vel: 2.89 m/s
Area-P 1/2: 3.51 cm2
P 1/2 time: 497 msec
S' Lateral: 3.3 cm
